# Patient Record
Sex: Male | Born: 1975 | Race: White | Hispanic: No | Marital: Single | Smoking: Current every day smoker
Health system: Southern US, Community
[De-identification: ages and names within clinical notes are randomized; demographics above are authoritative.]

## PROBLEM LIST (undated history)

## (undated) HISTORY — PX: MANDIBLE FRACTURE SURGERY: SHX706

---

## 2018-01-01 ENCOUNTER — Emergency Department: Admission: EM | Admit: 2018-01-01 | Discharge: 2018-01-01 | Discharge status: Against Medical Advice | Payer: Self-pay

## 2018-01-01 NOTE — ED Notes (Signed)
Patient called to be triaged, no response noted at this time from patient.  Staff checked restrooms, lobby, and outdoor facility.

## 2018-01-01 NOTE — ED Notes (Signed)
Pt has been called x 2 without response

## 2018-01-01 NOTE — ED Triage Notes (Signed)
Called pt x2 without answer 

## 2018-01-01 NOTE — ED Notes (Signed)
Patient called to be triaged x3, no response noted at this time from patient.  Staff checked restrooms, lobby, and outdoor facility.   

## 2018-01-02 ENCOUNTER — Other Ambulatory Visit: Payer: Self-pay

## 2018-01-02 ENCOUNTER — Emergency Department
Admission: EM | Admit: 2018-01-02 | Discharge: 2018-01-02 | Discharge status: Against Medical Advice | Payer: No Typology Code available for payment source | Attending: Emergency Medicine | Admitting: Emergency Medicine

## 2018-01-02 ENCOUNTER — Emergency Department: Payer: No Typology Code available for payment source

## 2018-01-02 ENCOUNTER — Encounter: Payer: Self-pay | Admitting: Emergency Medicine

## 2018-01-02 DIAGNOSIS — Z5321 Procedure and treatment not carried out due to patient leaving prior to being seen by health care provider: Secondary | ICD-10-CM | POA: Insufficient documentation

## 2018-01-02 DIAGNOSIS — M542 Cervicalgia: Secondary | ICD-10-CM | POA: Insufficient documentation

## 2018-01-02 NOTE — ED Notes (Signed)
Pt left with friend. Pt walked out with friend after receiving xrays. Pt did not notify staff of any issues. Pt had told xray tech that he needed to be somewhere at 1330. Nurse waited additional time to make sure pt would not be returning to room.
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
## 2018-01-03 ENCOUNTER — Encounter: Payer: Self-pay | Admitting: Emergency Medicine

## 2018-01-03 ENCOUNTER — Emergency Department
Admission: EM | Admit: 2018-01-03 | Discharge: 2018-01-03 | Discharge status: Against Medical Advice | Payer: No Typology Code available for payment source | Attending: Emergency Medicine | Admitting: Emergency Medicine

## 2018-01-03 ENCOUNTER — Other Ambulatory Visit: Payer: Self-pay

## 2018-01-03 ENCOUNTER — Emergency Department
Admission: EM | Admit: 2018-01-03 | Discharge: 2018-01-03 | Disposition: A | Discharge status: Home / Self Care | Payer: No Typology Code available for payment source | Source: Home / Self Care | Attending: Emergency Medicine | Admitting: Emergency Medicine

## 2018-01-03 ENCOUNTER — Emergency Department: Payer: No Typology Code available for payment source

## 2018-01-03 DIAGNOSIS — Y92410 Unspecified street and highway as the place of occurrence of the external cause: Secondary | ICD-10-CM | POA: Insufficient documentation

## 2018-01-03 DIAGNOSIS — M545 Low back pain, unspecified: Secondary | ICD-10-CM

## 2018-01-03 DIAGNOSIS — S12501A Unspecified nondisplaced fracture of sixth cervical vertebra, initial encounter for closed fracture: Secondary | ICD-10-CM | POA: Diagnosis not present

## 2018-01-03 DIAGNOSIS — S129XXA Fracture of neck, unspecified, initial encounter: Secondary | ICD-10-CM | POA: Insufficient documentation

## 2018-01-03 DIAGNOSIS — S199XXA Unspecified injury of neck, initial encounter: Secondary | ICD-10-CM | POA: Diagnosis present

## 2018-01-03 DIAGNOSIS — Z5329 Procedure and treatment not carried out because of patient's decision for other reasons: Secondary | ICD-10-CM | POA: Insufficient documentation

## 2018-01-03 DIAGNOSIS — Y9389 Activity, other specified: Secondary | ICD-10-CM | POA: Insufficient documentation

## 2018-01-03 DIAGNOSIS — S12401A Unspecified nondisplaced fracture of fifth cervical vertebra, initial encounter for closed fracture: Secondary | ICD-10-CM | POA: Insufficient documentation

## 2018-01-03 DIAGNOSIS — Y999 Unspecified external cause status: Secondary | ICD-10-CM

## 2018-01-03 DIAGNOSIS — F17210 Nicotine dependence, cigarettes, uncomplicated: Secondary | ICD-10-CM | POA: Insufficient documentation
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
## 2018-01-03 NOTE — ED Notes (Signed)
D/C instructions reviewed with the patient. Pt says "where are my pain meds, I am going to flip the f**k out if he doesn't give me any medications." Spoke with provider, no new RX at time of d/c. Went in to speak with pt, telling him no new scripts at this time. Pt threw d/c paperwork at the wall, said "man, f**k this" and walked briskly with steady gait out of room to d/c lobby. Pt did not sign for paperwork prior to leaving.

## 2018-01-03 NOTE — ED Notes (Signed)
Pt not in room - per pt in 53, pt is in his car going to lawyers appointment

## 2018-01-03 NOTE — ED Notes (Signed)
Pt oob to br x 2. Equal stride, no weakness noted. MOE x 4. NAD

## 2018-01-04 ENCOUNTER — Encounter: Payer: Self-pay | Admitting: Emergency Medicine

## 2018-01-04 ENCOUNTER — Other Ambulatory Visit: Payer: Self-pay

## 2018-01-04 ENCOUNTER — Emergency Department
Admission: EM | Admit: 2018-01-04 | Discharge: 2018-01-04 | Disposition: A | Discharge status: Home / Self Care | Payer: No Typology Code available for payment source | Attending: Emergency Medicine | Admitting: Emergency Medicine

## 2018-01-04 DIAGNOSIS — Y939 Activity, unspecified: Secondary | ICD-10-CM | POA: Diagnosis not present

## 2018-01-04 DIAGNOSIS — Y929 Unspecified place or not applicable: Secondary | ICD-10-CM | POA: Diagnosis not present

## 2018-01-04 DIAGNOSIS — S12401D Unspecified nondisplaced fracture of fifth cervical vertebra, subsequent encounter for fracture with routine healing: Secondary | ICD-10-CM | POA: Diagnosis not present

## 2018-01-04 DIAGNOSIS — Y33XXXD Other specified events, undetermined intent, subsequent encounter: Secondary | ICD-10-CM | POA: Insufficient documentation

## 2018-01-04 DIAGNOSIS — F17210 Nicotine dependence, cigarettes, uncomplicated: Secondary | ICD-10-CM | POA: Insufficient documentation

## 2018-01-04 DIAGNOSIS — S12501A Unspecified nondisplaced fracture of sixth cervical vertebra, initial encounter for closed fracture: Secondary | ICD-10-CM

## 2018-01-04 DIAGNOSIS — Y999 Unspecified external cause status: Secondary | ICD-10-CM | POA: Insufficient documentation

## 2018-01-04 DIAGNOSIS — S12501D Unspecified nondisplaced fracture of sixth cervical vertebra, subsequent encounter for fracture with routine healing: Secondary | ICD-10-CM | POA: Diagnosis not present

## 2018-01-04 MED ORDER — IBUPROFEN 600 MG PO TABS: 600.0000 mg | ORAL_TABLET | Freq: Three times a day (TID) | ORAL | 0 refills | Status: DC | PRN

## 2018-01-04 MED ORDER — IBUPROFEN 600 MG PO TABS: 600.0000 mg | ORAL_TABLET | Freq: Three times a day (TID) | ORAL | 0 refills | Status: AC | PRN

## 2018-01-04 MED ORDER — HYDROCODONE-ACETAMINOPHEN 5-325 MG PO TABS: 1.0000 | ORAL_TABLET | Freq: Four times a day (QID) | ORAL | 0 refills | Status: AC | PRN

## 2018-01-04 NOTE — ED Provider Notes (Addendum)
[facility] Emergency Department Provider Note  ____________________________________________   First MD Initiated Contact with Patient 01/04/18 1438     (approximate)  I have reviewed the triage vital signs and the nursing notes.   HISTORY  Chief Complaint Neck Pain   HPI James Wood is a 42 y.o. male is here to obtain information on who he is to follow-up with for neurosurgery.  Patient states he was seen in the emergency department yesterday and was "mad" and threw his paperwork away.  Now he wants discharge papers to show who he is to follow-up with.  He rates his pain as 10/10.   History reviewed. No pertinent past medical history.  There are no active problems to display for this patient.   Past Surgical History:  Procedure Laterality Date  . MANDIBLE FRACTURE SURGERY      Prior to Admission medications   Medication Sig Start Date End Date Taking? Authorizing Provider  HYDROcodone-acetaminophen (NORCO/VICODIN) 5-325 MG tablet Take 1 tablet by mouth every 6 (six) hours as needed for moderate pain. 01/04/18   Tommi RumpsSummers, Rhonda L, PA-C  ibuprofen (ADVIL,MOTRIN) 600 MG tablet Take 1 tablet (600 mg total) by mouth every 8 (eight) hours as needed. 01/04/18   Tommi RumpsSummers, Rhonda L, PA-C    Allergies Tramadol  No family history on file.  Social History Social History   Tobacco Use  . Smoking status: Current Every Day Smoker    Packs/day: 0.50    Types: Cigarettes  . Smokeless tobacco: Never Used  Substance Use Topics  . Alcohol use: No    Frequency: Never  . Drug use: No    Review of Systems Constitutional: No fever/chills Musculoskeletal: Positive for cervical pain. Skin: Negative for rash. Neurological: Negative for headaches, focal weakness or numbness. ____________________________________________   PHYSICAL EXAM:  VITAL SIGNS: ED Triage Vitals  Enc Vitals Group     BP 01/04/18 1348 108/74     Pulse Rate 01/04/18 1348 63       Resp 01/04/18 1348 16     Temp 01/04/18 1348 97.6 F (36.4 C)     Temp Source 01/04/18 1348 Oral     SpO2 01/04/18 1348 100 %     Weight 01/04/18 1348 200 lb (90.7 kg)     Height 01/04/18 1348 6\' 1"  (1.854 m)     Head Circumference --      Peak Flow --      Pain Score 01/04/18 1357 10     Pain Loc --      Pain Edu? --      Excl. in GC? --    Constitutional: Alert and oriented. Well appearing and in no acute distress. Eyes: Conjunctivae are normal.  Head: Atraumatic. Nose: No congestion/rhinnorhea. Neck: No stridor.  Hard cervical collar in place. Respiratory: Normal respiratory effort.  No retractions. Musculoskeletal: Moves upper and lower extremities without any difficulty and patient was noted to be pacing back and forth from exam room into the hallway frequently.  No gait instability was noted. Neurologic:  Normal speech and language. No gross focal neurologic deficits are appreciated.  Skin:  Skin is warm, dry and intact.  Psychiatric: Mood and affect are normal. Speech and behavior are normal.  ____________________________________________   LABS (all labs ordered are listed, but only abnormal results are displayed)  Labs Reviewed - No data to display   PROCEDURES  Procedure(s) performed: None  Procedures  Critical Care performed: No  ____________________________________________   INITIAL IMPRESSION /  ASSESSMENT AND PLAN / ED COURSE  As part of my medical decision making, I reviewed the following data within the electronic MEDICAL RECORD NUMBER Notes from prior ED visits and [place] Controlled Substance Database  Review of patient's 2 visits at [facility] was done and x-rays were reviewed.  Patient has a nondisplaced cervical fracture of see 5 and 6 which appear to be stable.  Patient did not have any complaints of paresthesias.  ED visit at [facility] and [facility]'s a day notes were reviewed and patient left after being verbally abusive to staff when a Toradol injection was ordered.   Patient left refusing the Toradol.  Patient left the [facility] facility without being seen.  Patient is to follow-up with Dr. Myer Haff who is the neurosurgeon that was on call yesterday that he was to call.  Patient was given ibuprofen 600 mg 3 times daily for inflammation.  Patient was given a prescription for Norco 1 every 6 hours as needed for pain #12.  This was written after the [facility] database was checked and patient has not received any narcotics by prescription since 2016.  Patient continued to be verbally abusive, calling Dr. Pershing Proud "a faggot", frequently shouting "this fucking place" and  coming to the desk frequently wanting to know when he would be discharged.  Patient was noted to be lying on the stretcher without his cervical collar on in a supine position.  Patient had no difficulty walking in the emergency department.  He was encouraged to call and make an appointment with the neurosurgeon as soon as possible.  Patient was very rude and verbally abusive towards the this examiner also.   ____________________________________________   FINAL CLINICAL IMPRESSION(S) / ED DIAGNOSES  Final diagnoses:  Closed nondisplaced fracture of fifth cervical vertebra with routine healing, unspecified fracture morphology, subsequent encounter  Closed nondisplaced fracture of sixth cervical vertebra, unspecified fracture morphology, initial encounter [facility])     ED Discharge Orders        Ordered    ibuprofen (ADVIL,MOTRIN) 600 MG tablet  Every 8 hours PRN,   Status:  Discontinued     01/04/18 1527    ibuprofen (ADVIL,MOTRIN) 600 MG tablet  Every 8 hours PRN     01/04/18 1528    HYDROcodone-acetaminophen (NORCO/VICODIN) 5-325 MG tablet  Every 6 hours PRN     01/04/18 1528       Note:  This document was prepared using Dragon voice recognition software and may include unintentional dictation errors.    Tommi Rumps, PA-C 01/04/18 1657    Tommi Rumps, PA-C 01/05/18 4098      Jeanmarie Plant, MD 01/05/18 [phone]

## 2018-01-04 NOTE — ED Notes (Signed)
Standing at door way   demanding something for pain  Provider made aware
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
## 2018-01-04 NOTE — Discharge Instructions (Signed)
You will need to call and make an appointment with the neurosurgeon listed on your discharge papers. This is the same neurosurgeon that was listed on your discharge papers when [facility] are [facility]. Continue wearing your cervical collar for protection and support. Ibuprofen 600 mg 3 times daily with food.  Norco 1 every 8 hours as needed for pain.
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
